# Patient Record
Sex: Male | Born: 2000 | Race: Black or African American | Hispanic: No | Marital: Single | Smoking: Never smoker
Health system: Southern US, Community
[De-identification: ages and names within clinical notes are randomized; demographics above are authoritative.]

---
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
## 2022-01-30 DIAGNOSIS — Z419 Encounter for procedure for purposes other than remedying health state, unspecified: Secondary | ICD-10-CM | POA: Diagnosis not present

## 2022-03-02 DIAGNOSIS — Z419 Encounter for procedure for purposes other than remedying health state, unspecified: Secondary | ICD-10-CM | POA: Diagnosis not present

## 2022-04-01 DIAGNOSIS — Z419 Encounter for procedure for purposes other than remedying health state, unspecified: Secondary | ICD-10-CM | POA: Diagnosis not present

## 2022-05-02 DIAGNOSIS — Z419 Encounter for procedure for purposes other than remedying health state, unspecified: Secondary | ICD-10-CM | POA: Diagnosis not present

## 2022-06-01 DIAGNOSIS — Z419 Encounter for procedure for purposes other than remedying health state, unspecified: Secondary | ICD-10-CM | POA: Diagnosis not present

## 2022-07-02 DIAGNOSIS — Z419 Encounter for procedure for purposes other than remedying health state, unspecified: Secondary | ICD-10-CM | POA: Diagnosis not present
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
